# Patient Record
Sex: Male | Born: 1990 | Race: White | Hispanic: No | Marital: Single | Smoking: Never smoker
Health system: Southern US, Community
[De-identification: ages and names within clinical notes are randomized; demographics above are authoritative.]

---
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
## 2014-03-06 ENCOUNTER — Emergency Department (HOSPITAL_COMMUNITY): Payer: Self-pay

## 2014-03-06 ENCOUNTER — Emergency Department (HOSPITAL_COMMUNITY)
Admission: EM | Admit: 2014-03-06 | Discharge: 2014-03-06 | Disposition: A | Discharge status: Home / Self Care | Payer: Self-pay | Attending: Emergency Medicine | Admitting: Emergency Medicine

## 2014-03-06 ENCOUNTER — Encounter (HOSPITAL_COMMUNITY): Payer: Self-pay | Admitting: Emergency Medicine

## 2014-03-06 DIAGNOSIS — K089 Disorder of teeth and supporting structures, unspecified: Secondary | ICD-10-CM | POA: Insufficient documentation

## 2014-03-06 DIAGNOSIS — R112 Nausea with vomiting, unspecified: Secondary | ICD-10-CM | POA: Insufficient documentation

## 2014-03-06 DIAGNOSIS — R739 Hyperglycemia, unspecified: Secondary | ICD-10-CM | POA: Insufficient documentation

## 2014-03-06 DIAGNOSIS — R000 Tachycardia, unspecified: Secondary | ICD-10-CM | POA: Insufficient documentation

## 2014-03-06 DIAGNOSIS — R569 Unspecified convulsions: Secondary | ICD-10-CM | POA: Insufficient documentation

## 2014-03-06 LAB — COMPREHENSIVE METABOLIC PANEL
ALT: 39 U/L (ref 0–53)
ANION GAP: 17 — AB (ref 5–15)
AST: 30 U/L (ref 0–37)
Albumin: 4.7 g/dL (ref 3.5–5.2)
Alkaline Phosphatase: 98 U/L (ref 39–117)
BILIRUBIN TOTAL: 0.4 mg/dL (ref 0.3–1.2)
BUN: 15 mg/dL (ref 6–23)
CHLORIDE: 98 meq/L (ref 96–112)
CO2: 25 mEq/L (ref 19–32)
CREATININE: 1.31 mg/dL (ref 0.50–1.35)
Calcium: 10.2 mg/dL (ref 8.4–10.5)
GFR calc non Af Amer: 75 mL/min — ABNORMAL LOW (ref >90)
GFR, EST AFRICAN AMERICAN: 86 mL/min — AB (ref >90)
Glucose, Bld: 150 mg/dL — ABNORMAL HIGH (ref 70–99)
Potassium: 3.1 mEq/L — ABNORMAL LOW (ref 3.7–5.3)
Sodium: 140 mEq/L (ref 137–147)
Total Protein: 8.6 g/dL — ABNORMAL HIGH (ref 6.0–8.3)

## 2014-03-06 LAB — CBC WITH DIFFERENTIAL/PLATELET
Basophils Absolute: 0.1 10*3/uL (ref 0.0–0.1)
Basophils Relative: 0 % (ref 0–1)
Eosinophils Absolute: 0.1 10*3/uL (ref 0.0–0.7)
Eosinophils Relative: 1 % (ref 0–5)
HEMATOCRIT: 43.5 % (ref 39.0–52.0)
HEMOGLOBIN: 15.8 g/dL (ref 13.0–17.0)
LYMPHS PCT: 38 % (ref 12–46)
Lymphs Abs: 4.9 10*3/uL — ABNORMAL HIGH (ref 0.7–4.0)
MCH: 31 pg (ref 26.0–34.0)
MCHC: 36.3 g/dL — AB (ref 30.0–36.0)
MCV: 85.3 fL (ref 78.0–100.0)
MONO ABS: 1.1 10*3/uL — AB (ref 0.1–1.0)
MONOS PCT: 8 % (ref 3–12)
Neutro Abs: 6.9 10*3/uL (ref 1.7–7.7)
Neutrophils Relative %: 53 % (ref 43–77)
Platelets: 367 10*3/uL (ref 150–400)
RBC: 5.1 MIL/uL (ref 4.22–5.81)
RDW: 11.9 % (ref 11.5–15.5)
WBC: 12.9 10*3/uL — AB (ref 4.0–10.5)

## 2014-03-06 LAB — RAPID URINE DRUG SCREEN, HOSP PERFORMED
Amphetamines: NOT DETECTED
Barbiturates: NOT DETECTED
Benzodiazepines: NOT DETECTED
Cocaine: NOT DETECTED
Opiates: NOT DETECTED
Tetrahydrocannabinol: NOT DETECTED

## 2014-03-06 LAB — ETHANOL: Alcohol, Ethyl (B): <11 mg/dL (ref 0–11)

## 2014-03-06 MED ORDER — POTASSIUM CHLORIDE 20 MEQ PO PACK
PACK | ORAL | Status: AC
  Filled 2014-03-06: qty 1

## 2014-03-06 MED ORDER — SODIUM CHLORIDE 0.9 % IV BOLUS (SEPSIS)
1000.0000 mL | Freq: Once | INTRAVENOUS | Status: AC
  Administered 2014-03-06: 1000 mL via INTRAVENOUS

## 2014-03-06 MED ORDER — POTASSIUM CHLORIDE CRYS ER 20 MEQ PO TBCR
20.0000 meq | EXTENDED_RELEASE_TABLET | Freq: Once | ORAL | Status: AC
  Administered 2014-03-06: 20 meq via ORAL
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
## 2014-03-06 NOTE — Discharge Instructions (Signed)
Please call Dr. Ronal Fearoonquah's office Monday for follow.  Do not do anything that could be dangerous to yourself or others if you had a seizure.  This included driving, being in water, or any activity that could harm you if you lost control of your body until you are rechecked and cleared by a doctor.    Seizure, Adult A seizure means there is unusual activity in the brain. A seizure can cause changes in attention or behavior. Seizures often cause shaking (convulsions). Seizures often last from 30 seconds to 2 minutes. HOME CARE   If you are given medicines, take them exactly as told by your doctor.  Keep all doctor visits as told.  Do not swim or drive until your doctor says it is okay.  Teach others what to do if you have a seizure. They should:  Lay you on the ground.  Put a cushion under your head.  Loosen any tight clothing around your neck.  Turn you on your side.  Stay with you until you get better. GET HELP RIGHT AWAY IF:   The seizure lasts longer than 2 to 5 minutes.  The seizure is very bad.  The person does not wake up after the seizure.  The person's attention or behavior changes. Drive the person to the emergency room or call your local emergency services (911 in [place] MAKE SURE YOU:   Understand these instructions.  Will watch your condition.  Will get help right away if you are not doing well or get worse. Document Released: 10/10/2007 Document Revised: 07/16/2011 Document Reviewed: 04/11/2011 [facility] Patient Information 2015 [place], [place]. This information is not intended to replace advice given to you by your health care provider. Make sure you discuss any questions you have with your health care provider. High Blood Sugar High blood sugar (hyperglycemia) means that the level of sugar in your blood is higher than it should be. Signs of high blood sugar include:  Feeling thirsty.  Frequent peeing (urinating).  Feeling tired or sleepy.  Dry  mouth.  Vision changes.  Feeling weak.  Feeling hungry but losing weight.  Numbness and tingling in your hands or feet.  Headache. When you ignore these signs, your blood sugar may keep going up. These problems may get worse, and other problems may begin. HOME CARE  Check your blood sugars as told by your doctor. Write down the numbers with the date and time.  Take the right amount of insulin or diabetes pills at the right time. Write down the dose with date and time.  Refill your insulin or diabetes pills before running out.  Watch what you eat. Follow your meal plan.  Drink liquids without sugar, such as water. Check with your doctor if you have kidney or heart disease.  Follow your doctor's orders for exercise. Exercise at the same time of day.  Keep your doctor's appointments. GET HELP RIGHT AWAY IF:   You have trouble thinking or are confused.  You have fast breathing with fruity smelling breath.  You pass out (faint).  You have 2 to 3 days of high blood sugars and you do not know why.  You have chest pain.  You are feeling sick to your stomach (nauseous) or throwing up (vomiting).  You have sudden vision changes. MAKE SURE YOU:   Understand these instructions.  Will watch your condition.  Will get help right away if you are not doing well or get worse. Document Released: 02/18/2009 Document Revised: 07/16/2011 Document Reviewed:  02/18/2009 [employer] Patient Information 2015 [place], [place]. This information is not intended to replace advice given to you by your health care provider. Make sure you discuss any questions you have with your health care provider.

## 2014-03-16 ENCOUNTER — Other Ambulatory Visit: Payer: Self-pay | Admitting: Neurology

## 2014-03-16 DIAGNOSIS — R569 Unspecified convulsions: Secondary | ICD-10-CM

## 2014-03-24 ENCOUNTER — Ambulatory Visit (HOSPITAL_COMMUNITY)
Admission: RE | Admit: 2014-03-24 | Discharge: 2014-03-24 | Disposition: A | Discharge status: Home / Self Care | Payer: Self-pay | Source: Ambulatory Visit | Attending: Neurology | Admitting: Neurology

## 2014-03-24 DIAGNOSIS — R569 Unspecified convulsions: Secondary | ICD-10-CM | POA: Insufficient documentation
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
## 2018-04-15 DIAGNOSIS — Z1389 Encounter for screening for other disorder: Secondary | ICD-10-CM | POA: Diagnosis not present

## 2018-04-15 DIAGNOSIS — Z0000 Encounter for general adult medical examination without abnormal findings: Secondary | ICD-10-CM | POA: Diagnosis not present

## 2018-04-15 DIAGNOSIS — E6609 Other obesity due to excess calories: Secondary | ICD-10-CM | POA: Diagnosis not present

## 2018-04-15 DIAGNOSIS — Z6832 Body mass index (BMI) 32.0-32.9, adult: Secondary | ICD-10-CM | POA: Diagnosis not present

## 2018-06-14 DIAGNOSIS — E162 Hypoglycemia, unspecified: Secondary | ICD-10-CM | POA: Diagnosis not present

## 2018-06-14 DIAGNOSIS — E161 Other hypoglycemia: Secondary | ICD-10-CM | POA: Diagnosis not present

## 2018-06-14 DIAGNOSIS — R569 Unspecified convulsions: Secondary | ICD-10-CM | POA: Diagnosis not present

## 2018-06-17 DIAGNOSIS — R42 Dizziness and giddiness: Secondary | ICD-10-CM | POA: Diagnosis not present

## 2018-06-17 DIAGNOSIS — R110 Nausea: Secondary | ICD-10-CM | POA: Diagnosis not present

## 2018-06-17 DIAGNOSIS — R61 Generalized hyperhidrosis: Secondary | ICD-10-CM | POA: Diagnosis not present
